# Patient Record
Sex: Male | Born: 1985 | Race: Black or African American | Hispanic: No | Marital: Single | Smoking: Current every day smoker
Health system: Southern US, Community
[De-identification: ages and names within clinical notes are randomized; demographics above are authoritative.]

## PROBLEM LIST (undated history)

## (undated) DIAGNOSIS — B20 Human immunodeficiency virus [HIV] disease: Secondary | ICD-10-CM

## (undated) DIAGNOSIS — F419 Anxiety disorder, unspecified: Secondary | ICD-10-CM

## (undated) DIAGNOSIS — F259 Schizoaffective disorder, unspecified: Secondary | ICD-10-CM

## (undated) DIAGNOSIS — I10 Essential (primary) hypertension: Secondary | ICD-10-CM

## (undated) DIAGNOSIS — R569 Unspecified convulsions: Secondary | ICD-10-CM

## (undated) DIAGNOSIS — F329 Major depressive disorder, single episode, unspecified: Secondary | ICD-10-CM

## (undated) HISTORY — DX: Essential (primary) hypertension: I10

## (undated) HISTORY — DX: Anxiety disorder, unspecified: F41.9

## (undated) HISTORY — DX: Unspecified convulsions: R56.9

## (undated) HISTORY — DX: Human immunodeficiency virus (HIV) disease: B20

## (undated) HISTORY — DX: Major depressive disorder, single episode, unspecified: F32.9

## (undated) HISTORY — DX: Schizoaffective disorder, unspecified: F25.9

---

## 2017-03-02 ENCOUNTER — Telehealth: Payer: Self-pay

## 2017-03-02 NOTE — Telephone Encounter (Signed)
Patient left message with Claris CheMargaret regarding return call to our office. I left a voicemail message on 6- 8-18 to call the office.     Returned call today no answer. Appointment information left on voicemail.  He was advised to call the office if this was a conflict with his schedule.   Laurell Josephsammy K Joceline Hinchcliff, RN

## 2017-03-10 ENCOUNTER — Encounter: Payer: Self-pay | Admitting: Infectious Diseases

## 2017-03-10 ENCOUNTER — Ambulatory Visit (INDEPENDENT_AMBULATORY_CARE_PROVIDER_SITE_OTHER): Payer: Medicaid Other | Admitting: Infectious Diseases

## 2017-03-10 ENCOUNTER — Ambulatory Visit (INDEPENDENT_AMBULATORY_CARE_PROVIDER_SITE_OTHER): Payer: Medicaid Other | Admitting: Licensed Clinical Social Worker

## 2017-03-10 VITALS — BP 122/86 | HR 78 | Temp 98.3°F | Wt 122.0 lb

## 2017-03-10 DIAGNOSIS — Z113 Encounter for screening for infections with a predominantly sexual mode of transmission: Secondary | ICD-10-CM

## 2017-03-10 DIAGNOSIS — F32A Depression, unspecified: Secondary | ICD-10-CM

## 2017-03-10 DIAGNOSIS — R569 Unspecified convulsions: Secondary | ICD-10-CM

## 2017-03-10 DIAGNOSIS — F419 Anxiety disorder, unspecified: Secondary | ICD-10-CM

## 2017-03-10 DIAGNOSIS — Z0000 Encounter for general adult medical examination without abnormal findings: Secondary | ICD-10-CM | POA: Insufficient documentation

## 2017-03-10 DIAGNOSIS — I10 Essential (primary) hypertension: Secondary | ICD-10-CM | POA: Diagnosis not present

## 2017-03-10 DIAGNOSIS — F1410 Cocaine abuse, uncomplicated: Secondary | ICD-10-CM

## 2017-03-10 DIAGNOSIS — B20 Human immunodeficiency virus [HIV] disease: Secondary | ICD-10-CM

## 2017-03-10 DIAGNOSIS — Z21 Asymptomatic human immunodeficiency virus [HIV] infection status: Secondary | ICD-10-CM

## 2017-03-10 DIAGNOSIS — F331 Major depressive disorder, recurrent, moderate: Secondary | ICD-10-CM | POA: Diagnosis not present

## 2017-03-10 DIAGNOSIS — F329 Major depressive disorder, single episode, unspecified: Secondary | ICD-10-CM | POA: Insufficient documentation

## 2017-03-10 HISTORY — DX: Human immunodeficiency virus (HIV) disease: B20

## 2017-03-10 HISTORY — DX: Unspecified convulsions: R56.9

## 2017-03-10 HISTORY — DX: Asymptomatic human immunodeficiency virus (hiv) infection status: Z21

## 2017-03-10 HISTORY — DX: Depression, unspecified: F32.A

## 2017-03-10 HISTORY — DX: Anxiety disorder, unspecified: F41.9

## 2017-03-10 HISTORY — DX: Essential (primary) hypertension: I10

## 2017-03-10 LAB — CBC WITH DIFFERENTIAL/PLATELET
BASOS ABS: 0 {cells}/uL (ref 0–200)
Basophils Relative: 0 %
EOS PCT: 2 %
Eosinophils Absolute: 64 cells/uL (ref 15–500)
HCT: 44.8 % (ref 38.5–50.0)
HEMOGLOBIN: 15.3 g/dL (ref 13.2–17.1)
LYMPHS ABS: 1216 {cells}/uL (ref 850–3900)
Lymphocytes Relative: 38 %
MCH: 28 pg (ref 27.0–33.0)
MCHC: 34.2 g/dL (ref 32.0–36.0)
MCV: 81.9 fL (ref 80.0–100.0)
MONOS PCT: 10 %
MPV: 9.2 fL (ref 7.5–12.5)
Monocytes Absolute: 320 cells/uL (ref 200–950)
NEUTROS ABS: 1600 {cells}/uL (ref 1500–7800)
Neutrophils Relative %: 50 %
Platelets: 259 10*3/uL (ref 140–400)
RBC: 5.47 MIL/uL (ref 4.20–5.80)
RDW: 14.4 % (ref 11.0–15.0)
WBC: 3.2 10*3/uL — ABNORMAL LOW (ref 3.8–10.8)

## 2017-03-10 LAB — COMPLETE METABOLIC PANEL WITH GFR
ALBUMIN: 4.7 g/dL (ref 3.6–5.1)
ALK PHOS: 65 U/L (ref 40–115)
ALT: 12 U/L (ref 9–46)
AST: 20 U/L (ref 10–40)
BILIRUBIN TOTAL: 0.7 mg/dL (ref 0.2–1.2)
BUN: 13 mg/dL (ref 7–25)
CO2: 23 mmol/L (ref 20–31)
Calcium: 9.6 mg/dL (ref 8.6–10.3)
Chloride: 103 mmol/L (ref 98–110)
Creat: 1.09 mg/dL (ref 0.60–1.35)
GLUCOSE: 65 mg/dL (ref 65–99)
POTASSIUM: 4 mmol/L (ref 3.5–5.3)
SODIUM: 139 mmol/L (ref 135–146)
TOTAL PROTEIN: 7.5 g/dL (ref 6.1–8.1)

## 2017-03-10 MED ORDER — ABACAVIR-DOLUTEGRAVIR-LAMIVUD 600-50-300 MG PO TABS: 1.0000 | ORAL_TABLET | Freq: Every day | ORAL | 11 refills | Status: AC

## 2017-03-10 MED ORDER — MIRTAZAPINE 30 MG PO TABS: 15.0000 mg | ORAL_TABLET | Freq: Every day | ORAL | 3 refills | Status: DC

## 2017-03-10 NOTE — Assessment & Plan Note (Signed)
He has had a significant weight loss recently. BP controlled today in office. No need for medications at this time. Continue to monitor. Check CMET.

## 2017-03-10 NOTE — Assessment & Plan Note (Signed)
Previously treated for syphillis several times in past. Interested in testing today. Defer GC/C with current emotional state.

## 2017-03-10 NOTE — Assessment & Plan Note (Signed)
New patient here to establish HIV care. Diagnosed 2013. Continue Triumeq (Rx sent). Assess viral load and resistance with history of spotty adherence. Original resistance testing negative in 2013. Encouraged him to continue the process he has in place now for adherence as it sounds like he has improved despite struggles with depression/anxiety. CD4, VL, CMET, CBC and RPR testing obtained today. Defered GC/C swabs and urine sample at this time with current state.

## 2017-03-10 NOTE — Patient Instructions (Signed)
Continue taking your TRIUMEQ every day.   I am going to start you on a new medication you have taken in the past called REMERON - this is to help with your poor appetite and depression. You will take 1/2 of a tablet every evening.   We will check your labs today and review at your next visit.   It was very nice to meet you!

## 2017-03-10 NOTE — Assessment & Plan Note (Signed)
Uncontrolled. Continue taking Buspar, Trazadone (50mg ) and Risperdone. Will add back Mertazipine for appetite stimulant and depression as he was taking this a few years ago. Does not recall any issues while taking it. Will resume at 15mg  and titrate up (previously on 30mg ). I had him meet with Cordelia PenSherry and Dorene GrebeNatalie with [facility] today for mental health, substance abuse and housing support. I feel much of his exacerbation stems from perceived lack of options available to him. Crisis information provided should he worsen clinically. He tells us he will [facility] or tomorrow. He will enroll with [facility] case worker for housing assistance. He was also provided with food and water at the visit today.   I will have him come back in 2 weeks to touch base with him and see how he is doing emotionally and review lab work.

## 2017-03-10 NOTE — Progress Notes (Signed)
Patient Active Problem List   Diagnosis Date Noted  . HIV (human immunodeficiency virus infection) (HCC) 03/10/2017  . Depression 03/10/2017  . Anxiety 03/10/2017  . Hypertension 03/10/2017  . Routine screening for STI (sexually transmitted infection) 03/10/2017  . Seizure (HCC) 03/10/2017    Patient's Medications  New Prescriptions   ABACAVIR-DOLUTEGRAVIR-LAMIVUDINE (TRIUMEQ) 600-50-300 MG TABLET    Take 1 tablet by mouth daily.  Previous Medications   BUSPIRONE (BUSPAR) 15 MG TABLET    Take 15 mg by mouth 2 (two) times daily.   GABAPENTIN (NEURONTIN) 300 MG CAPSULE    Take 300 mg by mouth 2 (two) times daily.   RISPERIDONE (RISPERDAL) 1 MG TABLET    Take 1 mg by mouth 2 (two) times daily.   TRAZODONE (DESYREL) 50 MG TABLET    Take 50 mg by mouth at bedtime.  Modified Medications   Modified Medication Previous Medication   MIRTAZAPINE (REMERON) 30 MG TABLET mirtazapine (REMERON) 30 MG tablet      Take 0.5 tablets (15 mg total) by mouth at bedtime.    Take 30 mg by mouth at bedtime.  Discontinued Medications   ABACAVIR-DOLUTEGRAVIR-LAMIVUDINE (TRIUMEQ) 600-50-300 MG TABLET    Take 1 tablet by mouth daily.    Subjective: Troy Hays is here today for his first visit to [facility].   HIV = TJ [facility]. [place] to "get away from everything related to crack" and get himself clean. Originally living in [place] upon entry to [place] he relocated to [place] 2 months ago and is currently living with a friend of which he believes he is in a relationship with. He is sexually active and last encounter was 2 weeks ago. Reports condom use occasionally. He is bisexual and currently with a male partner. Associated symptoms include weight loss. He has in the past had difficulty taking his Triumeq daily with his depression but reports that for the last 6 weeks he has taken it every day.   Depression / Anxiety = Reports he  has struggled with depression/anxiety throughout his adulthood. Today he endorses he feels completely hopeless and unable to be surrounded with people that care. Denies suicidal ideations or plans, however he feels that should he die "no one would even care". He does have a history of previous attempt in 2011 and prolonged MH hospitalization. He reports a 8-10 lb weight loss recently that has been unintentional. Other associated symptoms include insomnia, racing thoughts, inability to eat and missing HIV medications. Modifying factors include Buspar, risperidone, and increasing his trazodone dose to 150 mg. Ultimately this does not help and he still finds himself staying up all night smoking a whole pack of cigarettes. He is concerned about needing to check himself into a mental health hospital again and wants to do what he can now to avoid this.   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 He has in the past attempted treatment programs 3 times with last attempt 06/2016. He attempted to relocate to help with temptation. Currently smoking marijuana and cigarettes daily.   Review of Systems: Review of Systems  Constitutional: Positive for weight loss. Negative for chills, fever and malaise/fatigue.  HENT: Negative for sore throat.   Eyes: Negative for pain.  Respiratory: Negative for cough, sputum production and shortness of breath.   Cardiovascular: Negative.   Gastrointestinal: Negative  for abdominal pain, diarrhea and vomiting.  Musculoskeletal: Negative for joint pain, myalgias and neck pain.  Skin: Negative for rash.  Neurological: Negative for seizures and headaches.  Psychiatric/Behavioral: Positive for depression and substance abuse. Negative for suicidal ideas. The patient is nervous/anxious and has insomnia.     Past Medical History:  Diagnosis Date  . Anxiety 03/10/2017  . Depression 03/10/2017  . HIV  (human immunodeficiency virus infection) (HCC) 03/10/2017  . Hypertension 03/10/2017  . Seizure (HCC) 03/10/2017    Social History  Substance Use Topics  . Smoking status: Current Every Day Smoker    Types: Cigarettes  . Smokeless tobacco: Never Used  . Alcohol use No    Family History  Problem Relation Age of Onset  . Depression Mother   . Suicidality Mother     No Known Allergies  Objective: CONSTITUTIONAL: Thin appearing male that is extremely tearful and depressed today.   Vitals:   03/10/17 1059  BP: 122/86  Pulse: 78  Temp: 98.3 F (36.8 C)   EYES: anicteric HENT: No thrush. Normal dentition.  CARD:Cor RRR and No murmurs RESP: clear to all lung fields a/p with normal rate and effort GI: Bowel sounds are normal, liver is not enlarged, spleen is not enlarged MS: peripheral pulses normal, no pedal edema, no clubbing or cyanosis SKIN: no rashes. Skin warm and dry  NEURO: alert and oriented x 3; depressed mood.    Lab Results Labs reviewed from clinical records obtained.   Assessment and Plan:  Problem List Items Addressed This Visit      Cardiovascular and Mediastinum   Hypertension    He has had a significant weight loss recently. BP controlled today in office. No need for medications at this time. Continue to monitor. Check CMET.         Other   Seizure (HCC)   Relevant Medications   gabapentin (NEURONTIN) 300 MG capsule   Routine screening for STI (sexually transmitted infection)    Previously treated for syphillis several times in past. Interested in testing today. Defer GC/C with current emotional state.       Relevant Orders   RPR   HIV (human immunodeficiency virus infection) (HCC) - Primary (Chronic)    New patient here to establish HIV care. Diagnosed 2013. Continue Triumeq (Rx sent). Assess viral load and resistance with history of spotty adherence. Original resistance testing negative in 2013. Encouraged him to continue the process he has in  place now for adherence as it sounds like he has improved despite struggles with depression/anxiety. CD4, VL, CMET, CBC and RPR testing obtained today. Defered GC/C swabs and urine sample at this time with current state.       Relevant Medications   abacavir-dolutegravir-lamiVUDine (TRIUMEQ) 600-50-300 MG tablet   Other Relevant Orders   T-helper cell (CD4)- ([facility] clinic only)   RPR   COMPLETE METABOLIC PANEL WITH GFR   CBC with Differential/Platelet   HIV RNA, RTPCR W/R GT (RTI, PI,INT)   Depression (Chronic)    Uncontrolled. Continue taking Buspar, Trazadone (50mg ) and Risperdone. Will add back Mertazipine for appetite stimulant and depression as he was taking this a few years ago. Does not recall any issues while taking it. Will resume at 15mg  and titrate up (previously on 30mg ). I had him meet with Cordelia Pen and Dorene Grebe with [facility] today for mental health, substance abuse and housing support. I feel much of his exacerbation stems from perceived lack of options available to him. Crisis information provided should  he worsen clinically. He tells [place] he will reach out to [facility] today or tomorrow. He will enroll with [facility] case worker for housing assistance. He was also provided with food and water at the visit today.   I will have him come back in 2 weeks to touch base with him and see how he is doing emotionally and review lab work.       Relevant Medications   busPIRone (BUSPAR) 15 MG tablet   traZODone (DESYREL) 50 MG tablet   mirtazapine (REMERON) 30 MG tablet   Anxiety   Relevant Medications   busPIRone (BUSPAR) 15 MG tablet   traZODone (DESYREL) 50 MG tablet   mirtazapine (REMERON) 30 MG tablet      Health maintenance = vaccination history reviewed from previous records. Assess for continued   Smoking cessation = not interested in quitting at this time with uncontrolled depression   I spent greater than 60 minutes with the patient including greater than 50% of time in face to face  counseling of the patient re HIV/mental health, obtaining and review of previous clinical records and coordination of his care.  Rexene Alberts, MSN, NP-C [facility]  03/10/17 1:19 PM

## 2017-03-11 LAB — RPR TITER

## 2017-03-11 LAB — RPR: RPR: REACTIVE — AB

## 2017-03-11 LAB — T-HELPER CELL (CD4) - (RCID CLINIC ONLY)
CD4 % Helper T Cell: 38 % (ref 33–55)
CD4 T CELL ABS: 530 /uL (ref 400–2700)
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
## 2017-03-12 LAB — FLUORESCENT TREPONEMAL AB(FTA)-IGG-BLD: Fluorescent Treponemal ABS: REACTIVE — AB

## 2017-03-13 LAB — HIV RNA, RTPCR W/R GT (RTI, PI,INT)
HIV-1 RNA, QN PCR: <1.3 Log copies/mL
HIV-1 RNA, QN PCR: <20 copies/mL

## 2017-03-24 ENCOUNTER — Ambulatory Visit: Payer: Medicaid Other | Admitting: Infectious Diseases

## 2017-03-24 ENCOUNTER — Encounter: Payer: Self-pay | Admitting: Licensed Clinical Social Worker

## 2017-04-08 ENCOUNTER — Other Ambulatory Visit (HOSPITAL_COMMUNITY)
Admission: RE | Admit: 2017-04-08 | Discharge: 2017-04-08 | Disposition: A | Discharge status: Home / Self Care | Payer: Medicaid Other | Source: Ambulatory Visit | Attending: Infectious Diseases | Admitting: Infectious Diseases

## 2017-04-08 ENCOUNTER — Ambulatory Visit (INDEPENDENT_AMBULATORY_CARE_PROVIDER_SITE_OTHER): Payer: Medicaid Other | Admitting: Infectious Diseases

## 2017-04-08 ENCOUNTER — Ambulatory Visit (INDEPENDENT_AMBULATORY_CARE_PROVIDER_SITE_OTHER): Payer: Medicaid Other | Admitting: Licensed Clinical Social Worker

## 2017-04-08 ENCOUNTER — Encounter: Payer: Self-pay | Admitting: Infectious Diseases

## 2017-04-08 VITALS — BP 121/83 | HR 75 | Temp 98.3°F | Ht 64.0 in | Wt 128.0 lb

## 2017-04-08 DIAGNOSIS — F331 Major depressive disorder, recurrent, moderate: Secondary | ICD-10-CM | POA: Diagnosis not present

## 2017-04-08 DIAGNOSIS — Z113 Encounter for screening for infections with a predominantly sexual mode of transmission: Secondary | ICD-10-CM | POA: Diagnosis not present

## 2017-04-08 DIAGNOSIS — B20 Human immunodeficiency virus [HIV] disease: Secondary | ICD-10-CM

## 2017-04-08 DIAGNOSIS — F259 Schizoaffective disorder, unspecified: Secondary | ICD-10-CM | POA: Diagnosis not present

## 2017-04-08 DIAGNOSIS — F1410 Cocaine abuse, uncomplicated: Secondary | ICD-10-CM

## 2017-04-08 DIAGNOSIS — R634 Abnormal weight loss: Secondary | ICD-10-CM

## 2017-04-08 HISTORY — DX: Schizoaffective disorder, unspecified: F25.9

## 2017-04-08 MED ORDER — RISPERIDONE 1 MG PO TABS: 1.0000 mg | ORAL_TABLET | Freq: Two times a day (BID) | ORAL | 0 refills | Status: AC

## 2017-04-08 MED ORDER — BUSPIRONE HCL 15 MG PO TABS: 15.0000 mg | ORAL_TABLET | Freq: Two times a day (BID) | ORAL | 0 refills | Status: DC

## 2017-04-08 MED ORDER — MEGESTROL ACETATE 625 MG/5ML PO SUSP: 625.0000 mg | Freq: Every day | ORAL | 3 refills | Status: DC

## 2017-04-08 MED ORDER — TRAZODONE HCL 50 MG PO TABS: 100.0000 mg | ORAL_TABLET | Freq: Every day | ORAL | 0 refills | Status: AC

## 2017-04-08 NOTE — Patient Instructions (Signed)
STOP taking mirtazapine (Remeron)  Start taking Megace once a day to improve your appetite.
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
## 2017-04-09 LAB — FLUORESCENT TREPONEMAL AB(FTA)-IGG-BLD: FLUORESCENT TREPONEMAL ABS: REACTIVE — AB

## 2017-04-09 LAB — RPR TITER: RPR Titer: 1:4 {titer}

## 2017-04-09 LAB — CYTOLOGY, (ORAL, ANAL, URETHRAL) ANCILLARY ONLY
Chlamydia: NEGATIVE
Chlamydia: NEGATIVE
Neisseria Gonorrhea: NEGATIVE
Neisseria Gonorrhea: NEGATIVE

## 2017-04-09 LAB — HEPATITIS C ANTIBODY: HCV AB: NEGATIVE

## 2017-04-09 LAB — RPR: RPR Ser Ql: REACTIVE — AB

## 2017-04-10 LAB — QUANTIFERON TB GOLD ASSAY (BLOOD)
INTERFERON GAMMA RELEASE ASSAY: NEGATIVE
MITOGEN-NIL SO: 8.9 [IU]/mL
QUANTIFERON NIL VALUE: 0.19 [IU]/mL
Quantiferon Tb Ag Minus Nil Value: 0.05 IU/mL

## 2017-04-25 ENCOUNTER — Encounter (HOSPITAL_COMMUNITY): Payer: Self-pay | Admitting: Emergency Medicine

## 2017-04-25 ENCOUNTER — Emergency Department (HOSPITAL_COMMUNITY)
Admission: EM | Admit: 2017-04-25 | Discharge: 2017-04-26 | Disposition: A | Discharge status: Home / Self Care | Payer: Medicaid Other | Attending: Emergency Medicine | Admitting: Emergency Medicine

## 2017-04-25 ENCOUNTER — Emergency Department (HOSPITAL_COMMUNITY): Payer: Medicaid Other

## 2017-04-25 DIAGNOSIS — Z79899 Other long term (current) drug therapy: Secondary | ICD-10-CM | POA: Insufficient documentation

## 2017-04-25 DIAGNOSIS — F17210 Nicotine dependence, cigarettes, uncomplicated: Secondary | ICD-10-CM | POA: Insufficient documentation

## 2017-04-25 DIAGNOSIS — I10 Essential (primary) hypertension: Secondary | ICD-10-CM | POA: Insufficient documentation

## 2017-04-25 DIAGNOSIS — R1031 Right lower quadrant pain: Secondary | ICD-10-CM | POA: Insufficient documentation

## 2017-04-25 DIAGNOSIS — M25551 Pain in right hip: Secondary | ICD-10-CM | POA: Diagnosis not present

## 2017-04-25 DIAGNOSIS — R197 Diarrhea, unspecified: Secondary | ICD-10-CM | POA: Insufficient documentation
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
## 2017-04-26 ENCOUNTER — Emergency Department (HOSPITAL_COMMUNITY): Payer: Medicaid Other

## 2017-04-26 LAB — CBC WITH DIFFERENTIAL/PLATELET
Basophils Absolute: 0 10*3/uL (ref 0.0–0.1)
Basophils Relative: 0 %
Eosinophils Absolute: 0.2 10*3/uL (ref 0.0–0.7)
Eosinophils Relative: 2 %
HCT: 42 % (ref 39.0–52.0)
Hemoglobin: 14.6 g/dL (ref 13.0–17.0)
Lymphocytes Relative: 30 %
Lymphs Abs: 2.4 10*3/uL (ref 0.7–4.0)
MCH: 27.9 pg (ref 26.0–34.0)
MCHC: 34.8 g/dL (ref 30.0–36.0)
MCV: 80.2 fL (ref 78.0–100.0)
Monocytes Absolute: 0.6 10*3/uL (ref 0.1–1.0)
Monocytes Relative: 8 %
Neutro Abs: 4.8 10*3/uL (ref 1.7–7.7)
Neutrophils Relative %: 60 %
Platelets: 291 10*3/uL (ref 150–400)
RBC: 5.24 MIL/uL (ref 4.22–5.81)
RDW: 13.9 % (ref 11.5–15.5)
WBC: 8 10*3/uL (ref 4.0–10.5)

## 2017-04-26 LAB — COMPREHENSIVE METABOLIC PANEL
ALT: 24 U/L (ref 17–63)
AST: 24 U/L (ref 15–41)
Albumin: 3.7 g/dL (ref 3.5–5.0)
Alkaline Phosphatase: 64 U/L (ref 38–126)
Anion gap: 6 (ref 5–15)
BUN: 17 mg/dL (ref 6–20)
CO2: 23 mmol/L (ref 22–32)
Calcium: 9.2 mg/dL (ref 8.9–10.3)
Chloride: 105 mmol/L (ref 101–111)
Creatinine, Ser: 1.03 mg/dL (ref 0.61–1.24)
GFR calc Af Amer: >60 mL/min (ref >60)
GFR calc non Af Amer: >60 mL/min (ref >60)
Glucose, Bld: 101 mg/dL — ABNORMAL HIGH (ref 65–99)
Potassium: 3.8 mmol/L (ref 3.5–5.1)
Sodium: 134 mmol/L — ABNORMAL LOW (ref 135–145)
Total Bilirubin: 0.5 mg/dL (ref 0.3–1.2)
Total Protein: 7 g/dL (ref 6.5–8.1)

## 2017-04-26 MED ORDER — IOPAMIDOL (ISOVUE-300) INJECTION 61%
INTRAVENOUS | Status: AC
  Administered 2017-04-26: 100 mL
  Filled 2017-04-26: qty 100

## 2017-04-26 MED ORDER — CYCLOBENZAPRINE HCL 10 MG PO TABS
10.0000 mg | ORAL_TABLET | Freq: Once | ORAL | Status: AC
  Administered 2017-04-26: 10 mg via ORAL
  Filled 2017-04-26: qty 1

## 2017-04-26 NOTE — ED Provider Notes (Addendum)
[facility]-EM[facility] DEPT Provider Note   CSN: [id number] Arrival date & time: 04/25/17  2135     History   Chief Complaint Chief Complaint  Patient presents with  . Groin Pain    HPI Troy Hays is a 31 y.o. HIV positive, male who presents to the ED with Right hip and groin pain that started yesterday. Patient reports that he was just walking inside from smoking a cigarette and felt the pain. He went to bed and this morning when he got up the pain was worse and has continued to get worse as the day went on. He denies any injury.  HPI  Past Medical History:  Diagnosis Date  . Anxiety 03/10/2017  . Depression 03/10/2017  . HIV (human immunodeficiency virus infection) (HCC) 03/10/2017  . Hypertension 03/10/2017  . Schizoaffective disorder (HCC) 04/08/2017   History of schizophrenia vs borderline personality disorder   . Seizure (HCC) 03/10/2017    Patient Active Problem List   Diagnosis Date Noted  . Schizoaffective disorder (HCC) 04/08/2017  . HIV (human immunodeficiency virus infection) (HCC) 03/10/2017  . Depression 03/10/2017  . Anxiety 03/10/2017  . Hypertension 03/10/2017  . Routine screening for STI (sexually transmitted infection) 03/10/2017  . Seizure (HCC) 03/10/2017    History reviewed. No pertinent surgical history.     Home Medications    Prior to Admission medications   Medication Sig Start Date End Date Taking? Authorizing Provider  abacavir-dolutegravir-lamiVUDine (TRIUMEQ) 600-50-300 MG tablet Take 1 tablet by mouth daily. Patient not taking: Reported on 04/08/2017 03/10/17   Blanchard Kelchixon, Stephanie N, NP  busPIRone (BUSPAR) 15 MG tablet Take 1 tablet (15 mg total) by mouth 2 (two) times daily. 04/08/17   Blanchard Kelchixon, Stephanie N, NP  gabapentin (NEURONTIN) 300 MG capsule Take 300 mg by mouth 2 (two) times daily.    [provider]  megestrol (MEGACE ES) 625 MG/5ML suspension Take 5 mLs (625 mg total) by mouth daily. 04/08/17   Blanchard Kelchixon, Stephanie N, NP  risperiDONE  (RISPERDAL) 1 MG tablet Take 1 tablet (1 mg total) by mouth 2 (two) times daily. 04/08/17   Blanchard Kelchixon, Stephanie N, NP  traZODone (DESYREL) 50 MG tablet Take 2-3 tablets (100-150 mg total) by mouth at bedtime. 04/08/17   Blanchard Kelchixon, Stephanie N, NP    Family History Family History  Problem Relation Age of Onset  . Depression Mother   . Suicidality Mother     Social History Social History  Substance Use Topics  . Smoking status: Current Every Day Smoker    Types: Cigarettes  . Smokeless tobacco: Never Used  . Alcohol use No     Allergies   Patient has no known allergies.   Review of Systems Review of Systems  Constitutional: Negative for chills and fever.  HENT: Negative for congestion, ear pain, sore throat and trouble swallowing.   Eyes: Negative for pain, redness, itching and visual disturbance.  Respiratory: Negative for cough, chest tightness and shortness of breath.   Cardiovascular: Negative for chest pain and leg swelling.  Gastrointestinal: Positive for diarrhea (x 2 days). Negative for abdominal pain, nausea and vomiting.  Genitourinary: Negative for dysuria, frequency, penile swelling and urgency.  Musculoskeletal: Positive for gait problem (due to pain). Negative for joint swelling and neck stiffness.  Skin: Negative for rash and wound.  Neurological: Negative for syncope, weakness and headaches.  Hematological: Negative for adenopathy.  Psychiatric/Behavioral: Negative for confusion and sleep disturbance.     Physical Exam Updated Vital Signs BP 110/66 (BP Location: Left  Arm)   Pulse 78   Temp 98.3 F (36.8 C) (Oral)   Resp 16   SpO2 100%   Physical Exam  Constitutional: He appears well-developed and well-nourished. No distress.  HENT:  Head: Normocephalic and atraumatic.  Eyes: EOM are normal.  Neck: Neck supple.  Cardiovascular: Normal rate and regular rhythm.   Pulmonary/Chest: Effort normal and breath sounds normal.  Abdominal: Soft. Bowel sounds are  normal. There is tenderness in the right lower quadrant. There is no rebound and no guarding.  Musculoskeletal:       Right hip: He exhibits tenderness. He exhibits normal range of motion, normal strength and no deformity.  Neurological: He is alert.  Skin: Skin is warm and dry.  Psychiatric: He has a normal mood and affect.  Nursing note and vitals reviewed.  Patient care [facility] @ 1:30 am. He will complete the physical exam and remainder of the visit.    ED Treatments / Results  Labs (all labs ordered are listed, but only abnormal results are displayed) Labs Reviewed  COMPREHENSIVE METABOLIC PANEL - Abnormal; Notable for the following:       Result Value   Sodium 134 (*)    Glucose, Bld 101 (*)    All other components within normal limits  CBC WITH DIFFERENTIAL/PLATELET    Radiology No results found.  Procedures Procedures (including critical care time)    Janne Napoleoneese, Hope M, NP 04/26/17 0149    Gilda CreasePollina, Christopher J, MD 04/26/17 0722    Janne NapoleonNeese, Hope M, NP 05/07/17 1116    Gilda CreasePollina, Christopher J, MD 05/11/17 [phone]

## 2017-04-26 NOTE — ED Notes (Signed)
Pt returned from CT °

## 2017-04-26 NOTE — ED Notes (Signed)
Patient transported to CT 

## 2017-04-26 NOTE — Discharge Instructions (Signed)
Please read attached information. If you experience any new or worsening signs or symptoms please return to the emergency room for evaluation. Please follow-up with your primary care provider or specialist as discussed.  Please use Tylenol or ibuprofen as needed for pain. °

## 2017-04-26 NOTE — ED Provider Notes (Signed)
  Patient care assumed from previous provider pending evaluation and further management. Patient notes that yesterday after coming in from smoking a cigarette and he developed right lower groin pain radiating to the posterior hip. Patient denies any significant trauma, reports the pain radiates up into his right lower quadrant. Patient notes loose stools, denies any fever, nausea vomiting, or any other abdominal pain. He denies any history of the same, denies any preceding injuries.  Patient denies any history of the same.  Patient reports symptoms are worse when laying on the right hip.  On exam patient has minor tenderness to palpation of the right lower quadrant and right lateral hip. His plain films are negative. He will have basic labs, CT scan for evaluation of abdominal pain.  Labs and CT returned with no significant findings. This is likely musculoskeletal pain. Patient will be instructed to use ibuprofen and Tylenol, monitor for any new or worsening signs or symptoms return immediately if any present. Patient verbalized understanding and agreement to today's plan had no further questions or concerns at time of discharge.   Vitals:   04/25/17 2149 04/26/17 0201  BP: (!) 129/92 113/67  Pulse: 83 91  Resp: 16 16  Temp: 98.6 F ([address])           Eyvonne MechanicHedges, Rhanda Lemire, PA-C 04/26/17 [id number]    Zadie RhineWickline, Donald, MD 04/26/17 [phone]

## 2017-05-01 ENCOUNTER — Encounter: Payer: Self-pay | Admitting: Infectious Diseases

## 2017-05-19 ENCOUNTER — Ambulatory Visit (INDEPENDENT_AMBULATORY_CARE_PROVIDER_SITE_OTHER): Payer: Medicaid Other | Admitting: Infectious Diseases

## 2017-05-19 ENCOUNTER — Other Ambulatory Visit: Payer: Self-pay | Admitting: Infectious Diseases

## 2017-05-19 ENCOUNTER — Encounter: Payer: Self-pay | Admitting: Infectious Diseases

## 2017-05-19 VITALS — BP 121/81 | HR 69 | Temp 98.4°F | Wt 130.0 lb

## 2017-05-19 DIAGNOSIS — Z23 Encounter for immunization: Secondary | ICD-10-CM

## 2017-05-19 DIAGNOSIS — J410 Simple chronic bronchitis: Secondary | ICD-10-CM

## 2017-05-19 DIAGNOSIS — Z0000 Encounter for general adult medical examination without abnormal findings: Secondary | ICD-10-CM

## 2017-05-19 DIAGNOSIS — F259 Schizoaffective disorder, unspecified: Secondary | ICD-10-CM

## 2017-05-19 DIAGNOSIS — B20 Human immunodeficiency virus [HIV] disease: Secondary | ICD-10-CM | POA: Diagnosis not present

## 2017-05-19 DIAGNOSIS — F331 Major depressive disorder, recurrent, moderate: Secondary | ICD-10-CM

## 2017-05-19 MED ORDER — PREDNISONE 20 MG PO TABS: 20.0000 mg | ORAL_TABLET | Freq: Two times a day (BID) | ORAL | 0 refills | Status: AC
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
## 2017-05-19 NOTE — Patient Instructions (Signed)
Nice to see you.   Continue Triumeq.   Will see you back in 4 months. With labs 2 weeks before

## 2017-05-19 NOTE — Assessment & Plan Note (Signed)
Pneumovax today. Will need Menveo next visit as well as Influenza. Megace has been working well to increase appetite - will continue this for him.

## 2017-05-19 NOTE — Assessment & Plan Note (Signed)
Stable on current regimen. No SI/HI and appears to be with appropriate mood today. Continued to reference [place] services and our counselor in addition to [facility] services for support and MH needs.

## 2017-05-20 ENCOUNTER — Ambulatory Visit: Payer: Medicaid Other | Admitting: Infectious Diseases

## 2017-06-08 ENCOUNTER — Ambulatory Visit: Payer: Self-pay | Admitting: Infectious Diseases

## 2017-07-13 ENCOUNTER — Ambulatory Visit (INDEPENDENT_AMBULATORY_CARE_PROVIDER_SITE_OTHER): Payer: Medicaid Other | Admitting: Infectious Diseases

## 2017-07-13 ENCOUNTER — Encounter: Payer: Self-pay | Admitting: Infectious Diseases

## 2017-07-13 VITALS — BP 136/87 | HR 83 | Temp 98.0°F | Wt 128.0 lb

## 2017-07-13 DIAGNOSIS — R059 Cough, unspecified: Secondary | ICD-10-CM

## 2017-07-13 DIAGNOSIS — B20 Human immunodeficiency virus [HIV] disease: Secondary | ICD-10-CM | POA: Diagnosis not present

## 2017-07-13 DIAGNOSIS — R05 Cough: Secondary | ICD-10-CM | POA: Insufficient documentation

## 2017-07-13 DIAGNOSIS — J208 Acute bronchitis due to other specified organisms: Secondary | ICD-10-CM

## 2017-07-13 DIAGNOSIS — J209 Acute bronchitis, unspecified: Secondary | ICD-10-CM

## 2017-07-13 DIAGNOSIS — Z23 Encounter for immunization: Secondary | ICD-10-CM

## 2017-07-13 MED ORDER — AZITHROMYCIN 250 MG PO TABS: ORAL_TABLET | ORAL | 0 refills | Status: AC

## 2017-07-13 NOTE — Assessment & Plan Note (Signed)
He has had excellent adherence with are here. Continue Triumeq. Will forward records to wherever he decides to move in the future.

## 2017-07-13 NOTE — Patient Instructions (Signed)
Will send you in an antibiotic called Azithromycin. Take 2 tablets on day 1 only; then take 1 tablet a day on day 2 until gone (5 days total).   Mucinex per box instructions will help to get secretions out of your chest. Saline sprays can be helpful for nasal congestion as well.   Can take tylenol for ear pain (2 extra strength tabs every 6 hours) for chest pain.   Fluids, fluids, fluids and rest.   Follow up as originally planned with me or if you fail to improve. Please let us know if you move so we can send records to wherever you go.

## 2017-07-13 NOTE — Progress Notes (Signed)
PCP - Patient, No Pcp Per    Patient Active Problem List   Diagnosis Date Noted  . Cough 07/13/2017  . Schizoaffective disorder (HCC) 04/08/2017  . HIV (human immunodeficiency virus infection) (HCC) 03/10/2017  . Depression 03/10/2017  . Anxiety 03/10/2017  . Hypertension 03/10/2017  . Healthcare maintenance 03/10/2017  . Seizure (HCC) 03/10/2017    Patient's Medications  New Prescriptions   AZITHROMYCIN (ZITHROMAX) 250 MG TABLET    Take 2 tabs on first day only then take 1 tab a day until gone.  Previous Medications   ABACAVIR-DOLUTEGRAVIR-LAMIVUDINE (TRIUMEQ) 600-50-300 MG TABLET    Take 1 tablet by mouth daily.   BUSPIRONE (BUSPAR) 15 MG TABLET    Take 1 tablet (15 mg total) by mouth 2 (two) times daily.   GABAPENTIN (NEURONTIN) 300 MG CAPSULE    Take 300 mg by mouth 2 (two) times daily.   MEGESTROL (MEGACE ES) 625 MG/5ML SUSPENSION    Take 5 mLs (625 mg total) by mouth daily.   RISPERIDONE (RISPERDAL) 1 MG TABLET    Take 1 tablet (1 mg total) by mouth 2 (two) times daily.   TRAZODONE (DESYREL) 50 MG TABLET    Take 2-3 tablets (100-150 mg total) by mouth at bedtime.  Modified Medications   No medications on file  Discontinued Medications   No medications on file    Subjective: Troy Hays presents to clinic today for acute visit for cough and chills. Dx 2003. No history of OIs.   HPI:  Cough = This is a new problem for Troy Hays. Has been ongoing 3 weeks now. Describes it to be productive at times with white/yellow mucus. Worse at night. Associated sx of night sweats, subjective chills, left ear pain, sore throat, nasal congestion. No vomiting or diarrhea. Decreased appetite and worried he has lost weight. Has tried several OTC remedies including Mucinex, cough syrup, and even his friends nebulizer (not sure exactly what was in it but made it worse.)   HIV = [place] [facility] [place] our clinic summer 2018. Originally  Dx 2003. No history of OIs. 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 He has been doing well with Triumeq and not missed a dose. Has been spending time in [place] recently with friends and now back in town as of 2 days ago. He continues to be homeless and bounces back and forth to people's houses. Tells me me may move to [place] to be with a previous ex or back to [place] where some family reside.   Health Maintenance = has not received flu shot yet. Pneumovax this summer previously. Asking for some food today.    Review of Systems  Constitutional: Positive for chills and malaise/fatigue. Negative for fever and weight loss.  HENT: Positive for congestion, ear pain and sore throat. Negative for sinus pain.   Respiratory: Positive for cough and sputum production. Negative for shortness of breath.   Cardiovascular: Positive for chest pain (Center of chest over breastbone). Negative for leg swelling.  Gastrointestinal: Negative for abdominal pain, diarrhea and vomiting.  Genitourinary: Negative for dysuria and flank pain.  Musculoskeletal: Negative for joint pain, myalgias and neck pain.  Skin: Negative for rash.  Neurological: Positive for headaches. Negative for dizziness and tingling.  Psychiatric/Behavioral: Negative for depression. The patient is not nervous/anxious.     Past Medical History:  Diagnosis Date  . Anxiety 03/10/2017  . Depression 03/10/2017  . HIV (human immunodeficiency virus infection) (  HCC) 03/10/2017  . Hypertension 03/10/2017  . Schizoaffective disorder (HCC) 04/08/2017   History of schizophrenia vs borderline personality disorder   . Seizure (HCC) 03/10/2017    Social History  Substance Use Topics  . Smoking status: Current Every Day Smoker    Types: Cigarettes  . Smokeless tobacco: Never Used  . Alcohol use No   No Known Allergies  Objective:  Vitals:   07/13/17 1030  BP: 136/87  Pulse: 83  Temp: 98 F (36.7 C)  TempSrc: Oral  Weight: 128 lb (58.1 kg)   Body mass index is  21.97 kg/m.  Physical Exam  Constitutional: He is oriented to person, place, and time.  Appears like he feels poorly today.   HENT:  Right Ear: Tympanic membrane, external ear and ear canal normal. No tenderness.  Left Ear: Tympanic membrane, external ear and ear canal normal. No tenderness.  Nose: No rhinorrhea. Right sinus exhibits no frontal sinus tenderness. Left sinus exhibits maxillary sinus tenderness. Left sinus exhibits no frontal sinus tenderness.  Mouth/Throat: Mucous membranes are normal. No uvula swelling. Posterior oropharyngeal erythema present.  Eyes: No scleral icterus.  Cardiovascular: Normal rate, regular rhythm and normal heart sounds.   Pulmonary/Chest: Effort normal and breath sounds normal. No respiratory distress. He has no wheezes. He has no rales.  Cough elicited with deep breath.   Abdominal: Soft. Bowel sounds are normal. He exhibits no distension.  Lymphadenopathy:    He has no cervical adenopathy.  Neurological: He is alert and oriented to person, place, and time.  Skin: Skin is warm and dry.  Psychiatric: Mood and affect normal.    Lab Results CD4 T Cell Abs (/uL)  Date Value  03/10/2017 530   Problem List Items Addressed This Visit      Other   Cough    Bronchitis which I presume has now progressed to bacterial infection considering his exam and duration of symptoms. Exam not concerning for pneumonia. Will rx azithromycin 5 day course. Advised to continue OTC symptomatic care as well. Rest. Fluids. Can return as needed if sx fail to improve.       HIV (human immunodeficiency virus infection) (HCC) (Chronic)    He has had excellent adherence with are here. Continue Triumeq. Will forward records to wherever he decides to move in the future.       Relevant Medications   azithromycin (ZITHROMAX) 250 MG tablet    Other Visit Diagnoses    Acute bronchitis due to infection    -  Primary   Relevant Medications   azithromycin (ZITHROMAX) 250 MG  tablet     Rexene Alberts, MSN, NP-C [facility]  07/13/17 11:11 AM

## 2017-07-13 NOTE — Assessment & Plan Note (Signed)
Bronchitis which I presume has now progressed to bacterial infection considering his exam and duration of symptoms. Exam not concerning for pneumonia. Will rx azithromycin 5 day course. Advised to continue OTC symptomatic care as well. Rest. Fluids. Can return as needed if sx fail to improve.

## 2017-07-14 ENCOUNTER — Ambulatory Visit: Payer: Medicaid Other | Admitting: Infectious Diseases

## 2017-07-20 ENCOUNTER — Telehealth: Payer: Self-pay

## 2017-07-20 NOTE — Telephone Encounter (Signed)
Checking medications.   Laurell Josephsammy K King, RN

## 2017-07-22 ENCOUNTER — Telehealth: Payer: Self-pay | Admitting: *Deleted

## 2017-07-22 NOTE — Telephone Encounter (Signed)
He will call Ladona Ridgelaylor Monday to confirm his plans (staying in [place] or [place] [facility] [place] [facility]

## 2017-07-22 NOTE — Telephone Encounter (Signed)
Thank you Marcelino DusterMichelle. He mentioned to me at the last visit he was considering this. Happy to help his transition.

## 2017-07-22 NOTE — Telephone Encounter (Signed)
Patient has notified [facility] that he is considering a move to [place] on 11/1. Case Manager Ladona Ridgelaylor will advise patient to contact [facility] or [facility] as soon as he decided to stay or return, ask for help establishing care with a clinic in [place]. Andree CossHowell, Michelle M, RN

## 2017-09-11 ENCOUNTER — Other Ambulatory Visit: Payer: Medicaid Other

## 2017-09-16 ENCOUNTER — Other Ambulatory Visit: Payer: Medicaid Other

## 2017-09-16 ENCOUNTER — Other Ambulatory Visit: Payer: Self-pay | Admitting: Infectious Diseases

## 2017-09-16 ENCOUNTER — Ambulatory Visit (HOSPITAL_COMMUNITY)
Admission: EM | Admit: 2017-09-16 | Discharge: 2017-09-16 | Disposition: A | Discharge status: Home / Self Care | Payer: Medicaid Other | Attending: Family Medicine | Admitting: Family Medicine

## 2017-09-16 ENCOUNTER — Encounter (HOSPITAL_COMMUNITY): Payer: Self-pay | Admitting: Emergency Medicine

## 2017-09-16 DIAGNOSIS — R634 Abnormal weight loss: Secondary | ICD-10-CM

## 2017-09-16 DIAGNOSIS — R1115 Cyclical vomiting syndrome unrelated to migraine: Secondary | ICD-10-CM

## 2017-09-16 DIAGNOSIS — G43A0 Cyclical vomiting, not intractable: Secondary | ICD-10-CM | POA: Diagnosis not present

## 2017-09-16 MED ORDER — ONDANSETRON 8 MG PO TBDP: 8.0000 mg | ORAL_TABLET | Freq: Three times a day (TID) | ORAL | 0 refills | Status: AC | PRN

## 2017-09-16 MED ORDER — DICYCLOMINE HCL 20 MG PO TABS: 20.0000 mg | ORAL_TABLET | Freq: Two times a day (BID) | ORAL | 0 refills | Status: AC

## 2017-09-16 NOTE — ED Provider Notes (Signed)
[facility]   [id number] 09/16/17 Arrival Time: 1136   SUBJECTIVE:  Troy Hays is a 31 y.o. male who presents to the urgent care with complaint of nausea and vomiting for 2 days after eating at a seafood restaurant.  These had the same symptoms before when he went to that restaurant.  He has had no diarrhea or fever.  Past Medical History:  Diagnosis Date  . Anxiety 03/10/2017  . Depression 03/10/2017  . HIV (human immunodeficiency virus infection) (HCC) 03/10/2017  . Hypertension 03/10/2017  . Schizoaffective disorder (HCC) 04/08/2017   History of schizophrenia vs borderline personality disorder   . Seizure (HCC) 03/10/2017   Family History  Problem Relation Age of Onset  . Depression Mother   . Suicidality Mother    Social History   Socioeconomic History  . Marital status: Single    Spouse name: Not on file  . Number of children: Not on file  . Years of education: Not on file  . Highest education level: Not on file  Social Needs  . Financial resource strain: Not on file  . Food insecurity - worry: Not on file  . Food insecurity - inability: Not on file  . Transportation needs - medical: Not on file  . Transportation needs - non-medical: Not on file  Occupational History  . Not on file  Tobacco Use  . Smoking status: Current Every Day Smoker    Types: Cigarettes  . Smokeless tobacco: Never Used  Substance and Sexual Activity  . Alcohol use: No  . Drug use: Yes    Types: Marijuana  . Sexual activity: Yes  Other Topics Concern  . Not on file  Social History Narrative  . Not on file   No outpatient medications have been marked as taking for the 09/16/17 encounter [facility](Hospital Encounter).   No Known Allergies    ROS: As per HPI, remainder of ROS negative.   OBJECTIVE:   Vitals:   09/16/17 1230  BP: 130/87  Pulse: 97  Resp: 16  Temp: 98.4 F (36.9 C)  TempSrc: Oral  SpO2: 99%     General appearance: alert; no distress Eyes: PERRL; EOMI;  conjunctiva normal HENT: normocephalic; atraumatic; TMs normal, canal normal, external ears normal without trauma; nasal mucosa normal; oral mucosa normal Neck: supple Lungs: clear to auscultation bilaterally Heart: regular rate and rhythm Abdomen: soft, diffusely tender with deep palpation;  Hyperactive bowel sounds normal; no masses or organomegaly; no guarding or rebound tenderness Back: no CVA tenderness Extremities: no cyanosis or edema; symmetrical with no gross deformities Skin: warm and dry Neurologic: normal gait; grossly normal Psychological: alert and cooperative; normal mood and affect      Labs:  Results for orders placed or performed during the hospital encounter of 04/25/17  CBC with Differential  Result Value Ref Range   WBC 8.0 4.0 - 10.5 K/uL   RBC 5.24 4.22 - 5.81 MIL/uL   Hemoglobin 14.6 13.0 - 17.0 g/dL   HCT 40.942.0 81.139.0 - 91.452.0 %   MCV 80.2 78.0 - 100.0 fL   MCH 27.9 26.0 - 34.0 pg   MCHC 34.8 30.0 - 36.0 g/dL   RDW 78.213.9 95.611.5 - 21.315.5 %   Platelets 291 150 - 400 K/uL   Neutrophils Relative % 60 %   Neutro Abs 4.8 1.7 - 7.7 K/uL   Lymphocytes Relative 30 %   Lymphs Abs 2.4 0.7 - 4.0 K/uL   Monocytes Relative 8 %   Monocytes Absolute 0.6 0.1 -  1.0 K/uL   Eosinophils Relative 2 %   Eosinophils Absolute 0.2 0.0 - 0.7 K/uL   Basophils Relative 0 %   Basophils Absolute 0.0 0.0 - 0.1 K/uL  Comprehensive metabolic panel  Result Value Ref Range   Sodium 134 (L) 135 - 145 mmol/L   Potassium 3.8 3.5 - 5.1 mmol/L   Chloride 105 101 - 111 mmol/L   CO2 23 22 - 32 mmol/L   Glucose, Bld 101 (H) 65 - 99 mg/dL   BUN 17 6 - 20 mg/dL   Creatinine, Ser 0.271.03 0.61 - 1.24 mg/dL   Calcium 9.2 8.9 - 25.310.3 mg/dL   Total Protein 7.0 6.5 - 8.1 g/dL   Albumin 3.7 3.5 - 5.0 g/dL   AST 24 15 - 41 U/L   ALT 24 17 - 63 U/L   Alkaline Phosphatase 64 38 - 126 U/L   Total Bilirubin 0.5 0.3 - 1.2 mg/dL   GFR calc non Af Amer >60 >60 mL/min   GFR calc Af Amer >60 >60 mL/min   Anion  gap 6 5 - 15    Labs Reviewed - No data to display  No results found.     ASSESSMENT & PLAN:  1. Non-intractable cyclical vomiting with nausea     Meds ordered this encounter  Medications  . ondansetron (ZOFRAN-ODT) 8 MG disintegrating tablet    Sig: Take 1 tablet (8 mg total) by mouth every 8 (eight) hours as needed for nausea.    Dispense:  12 tablet    Refill:  0  . dicyclomine (BENTYL) 20 MG tablet    Sig: Take 1 tablet (20 mg total) by mouth 2 (two) times daily.    Dispense:  20 tablet    Refill:  0    Reviewed expectations re: course of current medical issues. Questions answered. Outlined signs and symptoms indicating need for more acute intervention. Patient verbalized understanding. After Visit Summary given.    Procedures:      Elvina SidleLauenstein, Deivi Huckins, MD 09/16/17 1249

## 2017-09-16 NOTE — Discharge Instructions (Signed)
If vomiting lasts 6 more hours, or the abdominal pain worsens, go to the emergency room  Nothing by mouth for next 2 hours, then sips of clear liquids.

## 2017-09-16 NOTE — ED Triage Notes (Signed)
PT C/O: constant abd pain associated w/vomting and nausea... Reports this happened after eating sea food... This has happened x2 at the same restaurant   ONSET: 2 days  DENIES: fevers, diarrhea   TAKING MEDS: none  A&O x4... NAD... Ambulatory

## 2017-09-28 ENCOUNTER — Ambulatory Visit: Payer: Medicaid Other | Admitting: Infectious Diseases

## 2017-09-28 ENCOUNTER — Telehealth: Payer: Self-pay | Admitting: *Deleted

## 2017-09-28 NOTE — Telephone Encounter (Signed)
Patient called to inform that [place] and wanted to let us know. Advised him will make a note in his chart and good luck!

## 2018-11-28 IMAGING — CT CT ABD-PELV W/ CM
2 of 4 series · 15 of 46 positions shown, 17 images · IV contrast (Omni 300)
Comparison: None.

CLINICAL DATA: Acute onset of right lower quadrant abdominal pain
and right groin pain. Initial encounter.

EXAM:
CT ABDOMEN AND PELVIS WITH CONTRAST
TECHNIQUE: Multidetector CT imaging of the abdomen and pelvis was performed
using the standard protocol following bolus administration of
intravenous contrast.
CONTRAST:  100mL XAFFI6-UKK IOPAMIDOL (XAFFI6-UKK) INJECTION 61%

[Series 3: a/p w/ 5mm · axial · 0.78mm/px · z∈[+699,+1139]mm · 12 of 106 slices shown, 14 images]
[im 9/106  soft-tissue]
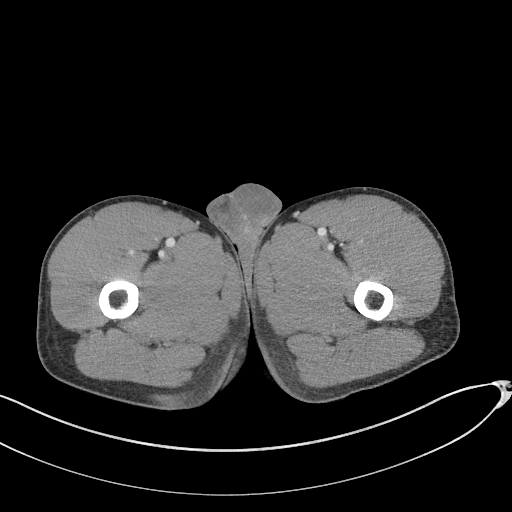
[im 9/106  bone]
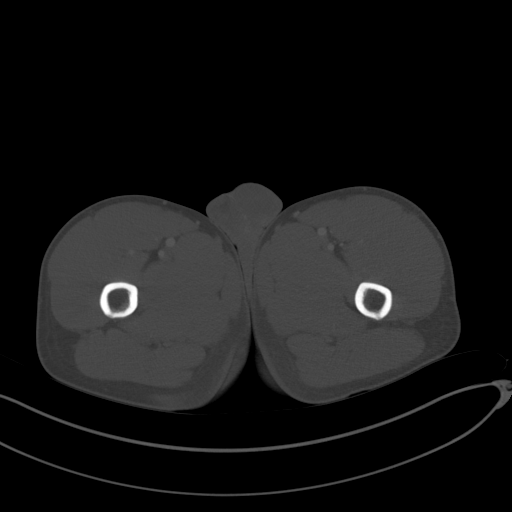
[im 17/106  soft-tissue]
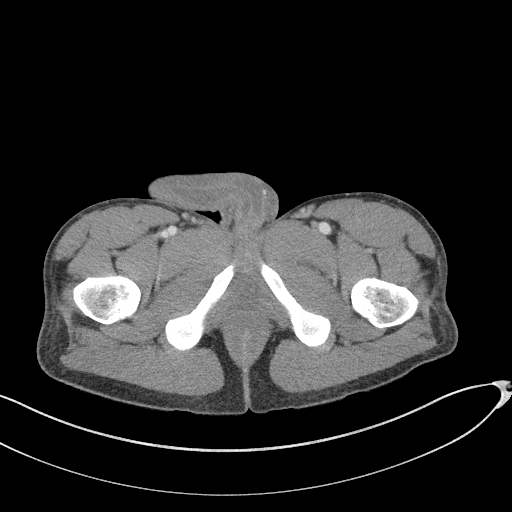
[im 25/106  soft-tissue]
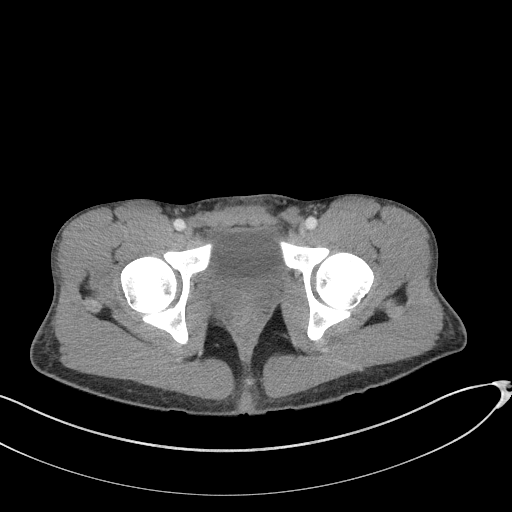
[im 33/106  soft-tissue]
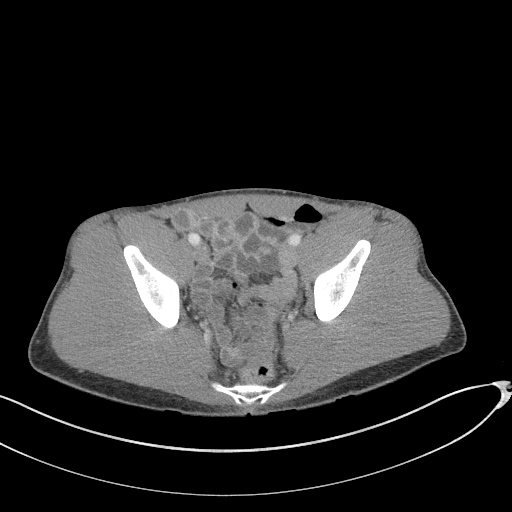
[im 41/106  soft-tissue]
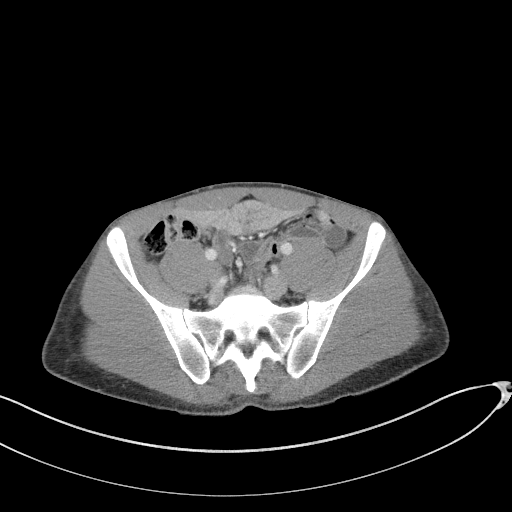
[im 49/106  soft-tissue]
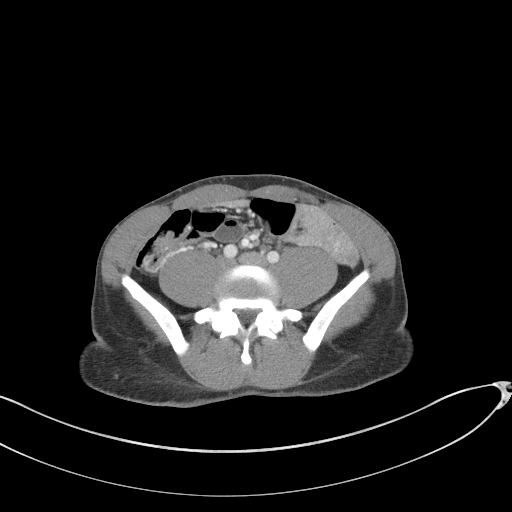
[im 57/106  soft-tissue]
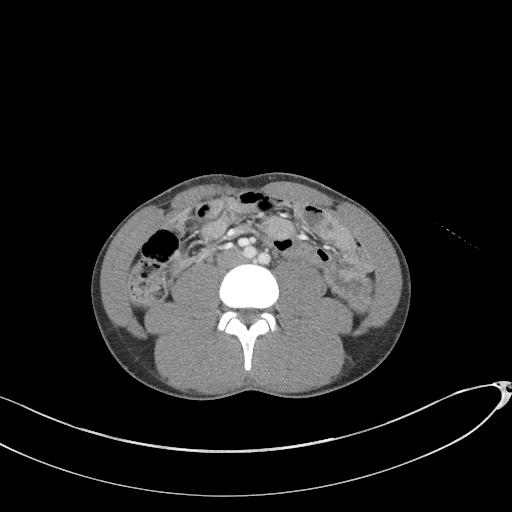
[im 65/106  soft-tissue]
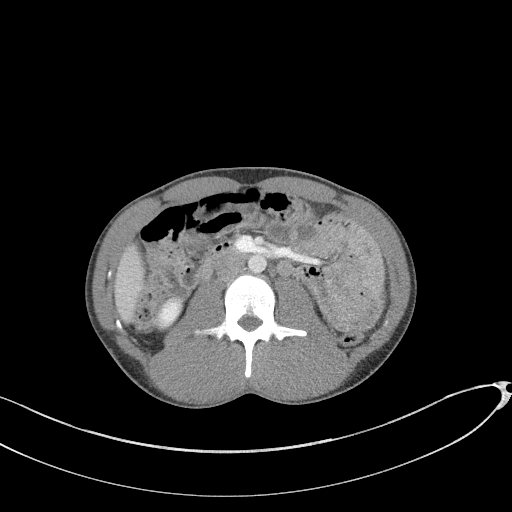
[im 73/106  soft-tissue]
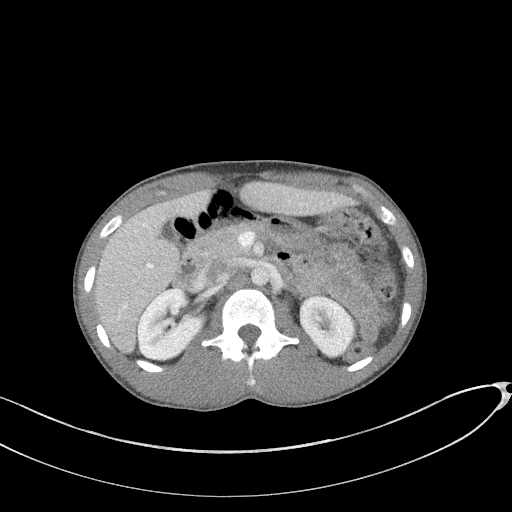
[im 73/106  bone]
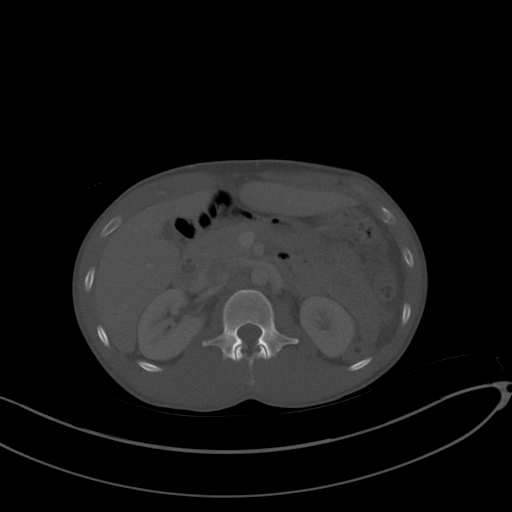
[im 81/106  soft-tissue]
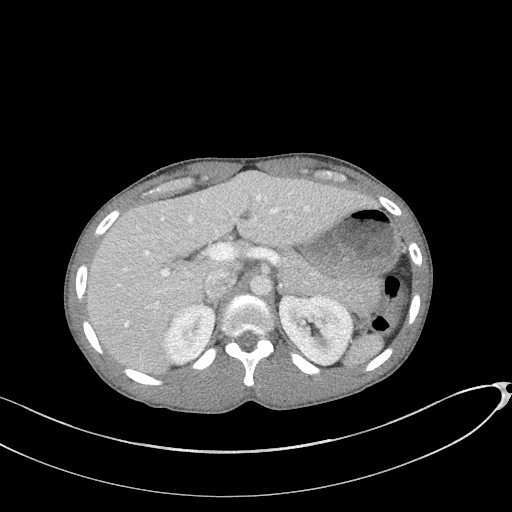
[im 89/106  soft-tissue]
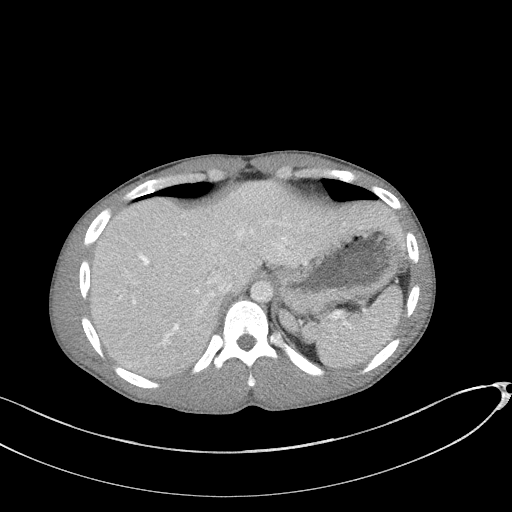
[im 97/106  soft-tissue]
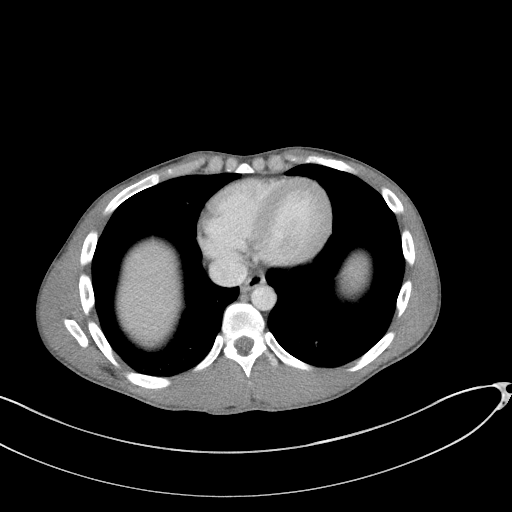

[Series 6: a/p w/ cor · coronal · 0.75mm/px · 3 of 149 slices shown]
[im 50/149  soft-tissue]
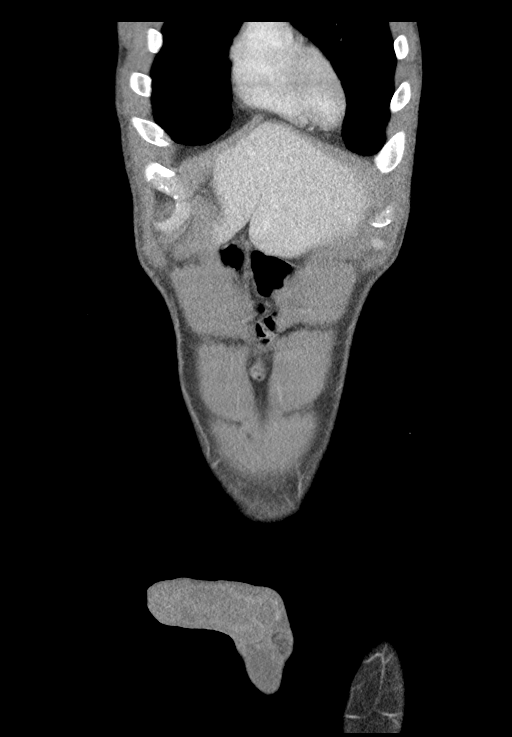
[im 66/149  soft-tissue]
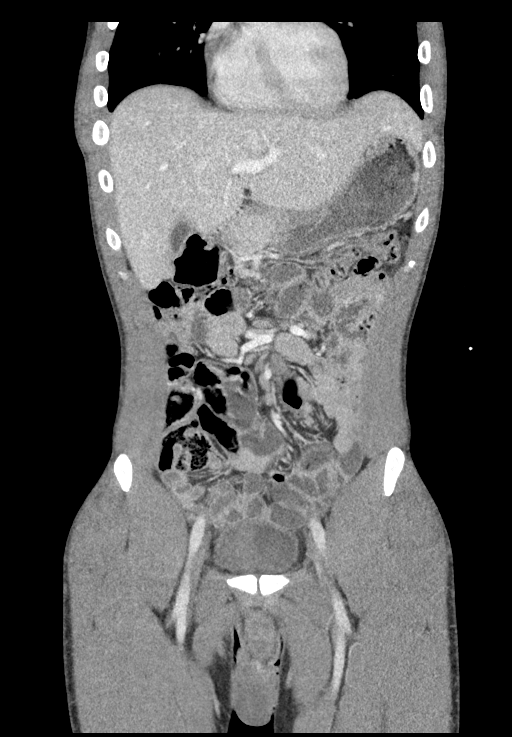
[im 83/149  soft-tissue]
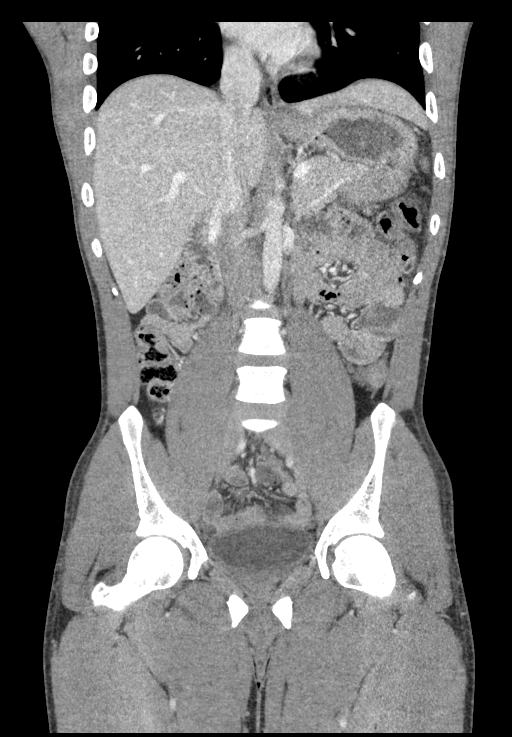

[15 of 46 positions shown; findings below may reference images not displayed]

FINDINGS: Lower chest: The visualized lung bases are grossly clear. The
visualized portions of the mediastinum are unremarkable.

Hepatobiliary: The liver is unremarkable in appearance. The
gallbladder is unremarkable in appearance. The common bile duct
remains normal in caliber.

Pancreas: The pancreas is within normal limits.

Spleen: The spleen is unremarkable in appearance.

Adrenals/Urinary Tract: The adrenal glands are unremarkable in
appearance. The kidneys are within normal limits. There is no
evidence of hydronephrosis. No renal or ureteral stones are
identified. No perinephric stranding is seen.

Stomach/Bowel: The stomach is unremarkable in appearance. The small
bowel is within normal limits. The appendix is normal in caliber,
without evidence of appendicitis. The colon is unremarkable in
appearance.

Vascular/Lymphatic: The abdominal aorta is unremarkable in
appearance. The inferior vena cava is grossly unremarkable. No
retroperitoneal lymphadenopathy is seen. No pelvic sidewall
lymphadenopathy is identified.

Reproductive: The bladder is mildly distended and grossly
unremarkable. The prostate remains normal in size.

Other: No additional soft tissue abnormalities are seen.

Musculoskeletal: No acute osseous abnormalities are identified. The
visualized musculature is unremarkable in appearance.
IMPRESSION: Unremarkable contrast-enhanced CT of the abdomen and pelvis.
